# Patient Record
Sex: Female | Born: 1990 | Race: Asian | Hispanic: No | Marital: Married | Smoking: Never smoker
Health system: Southern US, Community
[De-identification: ages and names within clinical notes are randomized; demographics above are authoritative.]

## PROBLEM LIST (undated history)

## (undated) DIAGNOSIS — Z789 Other specified health status: Secondary | ICD-10-CM

## (undated) HISTORY — PX: NO PAST SURGERIES: SHX2092

---

## 2015-03-18 ENCOUNTER — Other Ambulatory Visit (HOSPITAL_COMMUNITY): Payer: Self-pay | Admitting: Specialist

## 2015-03-18 DIAGNOSIS — IMO0002 Reserved for concepts with insufficient information to code with codable children: Secondary | ICD-10-CM

## 2015-03-18 DIAGNOSIS — IMO0001 Reserved for inherently not codable concepts without codable children: Secondary | ICD-10-CM

## 2015-03-18 DIAGNOSIS — O283 Abnormal ultrasonic finding on antenatal screening of mother: Secondary | ICD-10-CM

## 2015-03-18 DIAGNOSIS — Z0489 Encounter for examination and observation for other specified reasons: Secondary | ICD-10-CM

## 2015-03-19 ENCOUNTER — Encounter (HOSPITAL_COMMUNITY): Payer: Self-pay

## 2015-03-19 ENCOUNTER — Ambulatory Visit (HOSPITAL_COMMUNITY)
Admission: RE | Admit: 2015-03-19 | Discharge: 2015-03-19 | Disposition: A | Discharge status: Home / Self Care | Payer: Medicaid Other | Source: Ambulatory Visit | Attending: Specialist | Admitting: Specialist

## 2015-03-19 ENCOUNTER — Ambulatory Visit (HOSPITAL_COMMUNITY)
Admission: RE | Admit: 2015-03-19 | Payer: Medicaid Other | Source: Ambulatory Visit | Attending: Physician Assistant | Admitting: Physician Assistant

## 2015-03-19 DIAGNOSIS — O283 Abnormal ultrasonic finding on antenatal screening of mother: Secondary | ICD-10-CM | POA: Insufficient documentation

## 2015-03-19 DIAGNOSIS — IMO0001 Reserved for inherently not codable concepts without codable children: Secondary | ICD-10-CM

## 2015-03-19 DIAGNOSIS — Z36 Encounter for antenatal screening of mother: Secondary | ICD-10-CM | POA: Diagnosis not present

## 2015-03-19 DIAGNOSIS — Z0489 Encounter for examination and observation for other specified reasons: Secondary | ICD-10-CM

## 2015-03-19 DIAGNOSIS — IMO0002 Reserved for concepts with insufficient information to code with codable children: Secondary | ICD-10-CM

## 2015-03-19 HISTORY — DX: Other specified health status: Z78.9

## 2015-03-20 ENCOUNTER — Other Ambulatory Visit (HOSPITAL_COMMUNITY): Payer: Self-pay | Admitting: Specialist

## 2015-03-21 ENCOUNTER — Other Ambulatory Visit (HOSPITAL_COMMUNITY): Payer: Self-pay | Admitting: Specialist

## 2016-01-22 ENCOUNTER — Encounter (HOSPITAL_COMMUNITY): Payer: Self-pay | Admitting: *Deleted

## 2019-10-27 ENCOUNTER — Ambulatory Visit: Payer: Self-pay | Attending: Internal Medicine

## 2019-10-27 DIAGNOSIS — Z23 Encounter for immunization: Secondary | ICD-10-CM

## 2019-10-27 NOTE — Progress Notes (Signed)
   Covid-19 Vaccination Clinic  Name:  Judith Watson    MRN: [id number] DOB: 1990-09-21  10/27/2019  [name] was observed post Covid-19 immunization for 15 minutes without incident. She was provided with Vaccine Information Sheet and instruction to access the V-Safe system.   [name] was instructed to call 911 with any severe reactions post vaccine: Marland Kitchen Difficulty breathing  . Swelling of face and throat  . A fast heartbeat  . A bad rash all over body  . Dizziness and weakness   Immunizations Administered    Name Date Dose VIS Date Route   Pfizer COVID-19 Vaccine 10/27/2019  8:19 AM 0.3 mL 07/28/2019 Intramuscular   Manufacturer: [employer], [employer]   Lot: [id number]   NDC: [address]-2

## 2019-11-20 ENCOUNTER — Ambulatory Visit: Payer: Self-pay | Attending: Internal Medicine

## 2019-11-20 DIAGNOSIS — Z23 Encounter for immunization: Secondary | ICD-10-CM

## 2019-11-20 NOTE — Progress Notes (Signed)
   Covid-19 Vaccination Clinic  Name:  Judith Watson    MRN: [id number] DOB: [birth year]  11/20/2019  [name] was observed post Covid-19 immunization for 15 minutes without incident. She was provided with Vaccine Information Sheet and instruction to access the V-Safe system.   [name] was instructed to call 911 with any severe reactions post vaccine: Marland Kitchen Difficulty breathing  . Swelling of face and throat  . A fast heartbeat  . A bad rash all over body  . Dizziness and weakness   Immunizations Administered    Name Date Dose VIS Date Route   Pfizer COVID-19 Vaccine 11/20/2019 10:01 AM 0.3 mL 07/28/2019 Intramuscular   Manufacturer: [employer], [employer]   Lot: [id number]   NDC: [address]-9

## 2024-05-22 ENCOUNTER — Ambulatory Visit (INDEPENDENT_AMBULATORY_CARE_PROVIDER_SITE_OTHER): Payer: MEDICAID

## 2024-05-22 ENCOUNTER — Other Ambulatory Visit (HOSPITAL_COMMUNITY)
Admission: RE | Admit: 2024-05-22 | Discharge: 2024-05-22 | Disposition: A | Discharge status: Home / Self Care | Payer: MEDICAID | Source: Ambulatory Visit | Attending: Obstetrics and Gynecology | Admitting: Obstetrics and Gynecology

## 2024-05-22 ENCOUNTER — Other Ambulatory Visit: Payer: Self-pay

## 2024-05-22 VITALS — BP 95/59 | HR 74 | Ht 66.0 in | Wt 140.0 lb

## 2024-05-22 DIAGNOSIS — Z3481 Encounter for supervision of other normal pregnancy, first trimester: Secondary | ICD-10-CM | POA: Insufficient documentation

## 2024-05-22 DIAGNOSIS — Z3A01 Less than 8 weeks gestation of pregnancy: Secondary | ICD-10-CM

## 2024-05-22 LAB — POCT URINE PREGNANCY: Preg Test, Ur: POSITIVE — AB

## 2024-05-22 NOTE — Progress Notes (Signed)
 New OB Intake  I explained I am completing New OB Intake today. We discussed EDD of 01/07/2025, by Last Menstrual Period. Pt is [id number]. I reviewed her allergies, medications and Medical/Surgical/OB history.    Patient Active Problem List   Diagnosis Date Noted   Supervision of normal intrauterine pregnancy in multigravida in first trimester 05/22/2024    Concerns addressed today  Patient informed that the ultrasound is considered a limited obstetric ultrasound and is not intended to be a complete ultrasound exam.  Patient also informed that the ultrasound is not being completed with the intent of assessing for fetal or placental anomalies or any pelvic abnormalities. Explained that the purpose of today's ultrasound is to assess for viability.  Patient acknowledges the purpose of the exam and the limitations of the study.     Delivery Plans Plans to deliver at [facility] Discussed the nature of our practice with multiple providers including residents and students. Due to the size of the practice, the delivering provider may not be the same as those providing prenatal care.   MyChart/Babyscripts MyChart access verified. I explained pt will have some visits in office and some virtually. Babyscripts app discussed and ordered.   Blood Pressure Cuff Blood pressure cuff discussedDiscussed to be used for virtual visits and or if needed BP checks weekly.  Anatomy US  Explained first scheduled US  will be around 19 weeks.   Last Pap No results found for: DIAGPAP  First visit review I reviewed new OB appt with patient. Explained pt will be seen by Dr. Herchel at first visit. Discussed [place] genetic screening with patient and husband. Routine prenatal labs ordered.    Erminio DELENA Rumps, [place] 05/22/2024  2:29 PM

## 2024-05-23 ENCOUNTER — Ambulatory Visit: Payer: Self-pay | Admitting: Obstetrics and Gynecology

## 2024-05-23 DIAGNOSIS — D696 Thrombocytopenia, unspecified: Secondary | ICD-10-CM | POA: Insufficient documentation

## 2024-05-23 LAB — CBC/D/PLT+RPR+RH+ABO+RUBIGG...
Antibody Screen: NEGATIVE
Basophils Absolute: 0 x10E3/uL (ref 0.0–0.2)
Basos: 1 %
EOS (ABSOLUTE): 0.1 x10E3/uL (ref 0.0–0.4)
Eos: 2 %
HCV Ab: NONREACTIVE
HIV Screen 4th Generation wRfx: NONREACTIVE
Hematocrit: 38.6 % (ref 34.0–46.6)
Hemoglobin: 12.9 g/dL (ref 11.1–15.9)
Hepatitis B Surface Ag: NEGATIVE
Immature Grans (Abs): 0 x10E3/uL (ref 0.0–0.1)
Immature Granulocytes: 0 %
Lymphocytes Absolute: 1.5 x10E3/uL (ref 0.7–3.1)
Lymphs: 20 %
MCH: 31.9 pg (ref 26.6–33.0)
MCHC: 33.4 g/dL (ref 31.5–35.7)
MCV: 95 fL (ref 79–97)
Monocytes Absolute: 0.6 x10E3/uL (ref 0.1–0.9)
Monocytes: 7 %
Neutrophils Absolute: 5.2 x10E3/uL (ref 1.4–7.0)
Neutrophils: 70 %
Platelets: 144 x10E3/uL — ABNORMAL LOW (ref 150–450)
RBC: 4.05 x10E6/uL (ref 3.77–5.28)
RDW: 11.9 % (ref 11.7–15.4)
RPR Ser Ql: NONREACTIVE
Rh Factor: POSITIVE
Rubella Antibodies, IGG: 20.4 {index} (ref >0.99)
WBC: 7.5 x10E3/uL (ref 3.4–10.8)

## 2024-05-23 LAB — HCV INTERPRETATION

## 2024-05-24 LAB — CERVICOVAGINAL ANCILLARY ONLY
Chlamydia: NEGATIVE
Comment: NEGATIVE
Comment: NORMAL
Neisseria Gonorrhea: NEGATIVE

## 2024-05-24 LAB — URINE CULTURE, OB REFLEX

## 2024-05-24 LAB — CULTURE, OB URINE

## 2024-05-29 ENCOUNTER — Other Ambulatory Visit: Payer: Self-pay | Admitting: Obstetrics and Gynecology

## 2024-05-29 ENCOUNTER — Other Ambulatory Visit (INDEPENDENT_AMBULATORY_CARE_PROVIDER_SITE_OTHER): Payer: MEDICAID

## 2024-05-29 ENCOUNTER — Other Ambulatory Visit: Payer: Self-pay

## 2024-05-29 DIAGNOSIS — Z3A08 8 weeks gestation of pregnancy: Secondary | ICD-10-CM

## 2024-05-29 DIAGNOSIS — Z3481 Encounter for supervision of other normal pregnancy, first trimester: Secondary | ICD-10-CM

## 2024-05-29 NOTE — Telephone Encounter (Signed)
-----   Message from Rollo ONEIDA Bring sent at 05/24/2024  2:43 PM EDT ----- Normal swab & urine ----- Message ----- From: Venus Erminio LABOR, RN Sent: 05/22/2024   2:29 PM EDT To: Rollo ONEIDA Bring, MD

## 2024-05-29 NOTE — Telephone Encounter (Signed)
 Patient made aware. Danna Hefty, RN

## 2024-06-19 ENCOUNTER — Encounter: Payer: MEDICAID | Admitting: Obstetrics & Gynecology

## 2024-07-03 ENCOUNTER — Encounter: Payer: MEDICAID | Admitting: Obstetrics & Gynecology

## 2024-07-05 ENCOUNTER — Other Ambulatory Visit (HOSPITAL_COMMUNITY)
Admission: RE | Admit: 2024-07-05 | Discharge: 2024-07-05 | Disposition: A | Discharge status: Home / Self Care | Payer: MEDICAID | Source: Ambulatory Visit | Attending: Obstetrics & Gynecology | Admitting: Obstetrics & Gynecology

## 2024-07-05 ENCOUNTER — Encounter: Payer: Self-pay | Admitting: Obstetrics & Gynecology

## 2024-07-05 ENCOUNTER — Ambulatory Visit: Payer: MEDICAID | Admitting: Obstetrics & Gynecology

## 2024-07-05 VITALS — BP 99/64 | HR 80 | Wt 146.0 lb

## 2024-07-05 DIAGNOSIS — Z01419 Encounter for gynecological examination (general) (routine) without abnormal findings: Secondary | ICD-10-CM | POA: Diagnosis present

## 2024-07-05 DIAGNOSIS — Z3480 Encounter for supervision of other normal pregnancy, unspecified trimester: Secondary | ICD-10-CM | POA: Diagnosis not present

## 2024-07-05 DIAGNOSIS — Z113 Encounter for screening for infections with a predominantly sexual mode of transmission: Secondary | ICD-10-CM | POA: Diagnosis not present

## 2024-07-05 DIAGNOSIS — N898 Other specified noninflammatory disorders of vagina: Secondary | ICD-10-CM | POA: Diagnosis present

## 2024-07-05 DIAGNOSIS — Z124 Encounter for screening for malignant neoplasm of cervix: Secondary | ICD-10-CM | POA: Insufficient documentation

## 2024-07-05 DIAGNOSIS — Z1151 Encounter for screening for human papillomavirus (HPV): Secondary | ICD-10-CM | POA: Insufficient documentation

## 2024-07-05 DIAGNOSIS — Z3A13 13 weeks gestation of pregnancy: Secondary | ICD-10-CM

## 2024-07-05 NOTE — Progress Notes (Signed)
 INITIAL PRENATAL VISIT  History:  Judith Watson is a 33 y.o. [phone] at [redacted]w[redacted]d by LMP, early ultrasound being seen today for her first obstetrical visit.  Her obstetrical history is significant for two term pregnancies, had twins last pregnancy.  All vaginal deliveries. Patient does intend to breast feed. Pregnancy history fully reviewed.  Patient reports vaginal odor and discharge for a few days, wants evaluation.  HISTORY: OB History  Gravida Para Term Preterm AB Living  6 3 3  0 1 3  SAB IAB Ectopic Multiple Live Births  1 0 0 1 3    # Outcome Date GA Lbr Len/2nd Weight Sex Type Anes PTL Lv  6 Current           5A Term 04/16/15 [redacted]w[redacted]d  6 lb 3 oz (2.807 kg) M Vag-Spont None Y LIV  5B Term 04/16/15 [redacted]w[redacted]d  5 lb 9 oz (2.523 kg) M Vag-Spont None N LIV  4 Term 04/15/14 [redacted]w[redacted]d  7 lb 6 oz (3.345 kg) F Vag-Spont None Y LIV  3 Term           2 SAB           1 Gravida             Last pap smear was done many years ago and was normal  Past Medical History:  Diagnosis Date   Medical history non-contributory    Past Surgical History:  Procedure Laterality Date   NO PAST SURGERIES     Family History  Problem Relation Age of Onset   Asthma Mother    Asthma Daughter    Asthma Maternal Grandmother    Social History   Tobacco Use   Smoking status: Never   Smokeless tobacco: Never  Vaping Use   Vaping status: Never Used  Substance Use Topics   Alcohol use: Not Currently   Drug use: No   Allergies  Allergen Reactions   Latex    Current Outpatient Medications on File Prior to Visit  Medication Sig Dispense Refill   Prenatal Vit-Fe Fumarate-FA (PRENATAL VITAMIN PO) Take by mouth.     No current facility-administered medications on file prior to visit.    Review of Systems Pertinent items noted in HPI and remainder of comprehensive ROS otherwise negative.  Physical Exam:  Chaperone Shawnee Fleet, CMA   Vitals:   07/05/24 1352  BP: 99/64  Pulse: 80  Weight: 146 lb 0.6 oz  (66.2 kg)   Fetal Heart Rate (bpm): 159   General: well-developed, well-nourished female in no acute distress  Breasts:  normal appearance, no masses or tenderness bilaterally, exam done in the presence of a chaperone.   Skin: normal coloration and turgor, no rashes  Neurologic: oriented, normal, negative, normal mood  Extremities: normal strength, tone, and muscle mass, ROM of all joints is normal  HEENT PERRLA, extraocular movement intact and sclera clear, anicteric  Neck supple and no masses  Cardiovascular: regular rate and rhythm  Respiratory:  no respiratory distress, normal breath sounds  Abdomen: soft, non-tender; bowel sounds normal; no masses,  no organomegaly  Pelvic: normal external genitalia, no lesions, normal vaginal mucosa, yellow vaginal discharge seen and testing sample obtained, normal cervix, pap smear done. Exam done in the presence of a chaperone.    Assessment:  Pregnancy: [id number] Patient Active Problem List   Diagnosis Date Noted   Thrombocytopenia 05/23/2024   Supervision of normal intrauterine pregnancy in multigravida in first trimester 05/22/2024    Plan:  1.  Vaginal odor - Cervicovaginal ancillary only( [place]) done, will follow up results and manage accordingly.   2. [redacted] weeks gestation of pregnancy 3. Supervision of other normal pregnancy, antepartum (Primary) - Hemoglobin A1c - CBC - Cervicovaginal ancillary only( [place]) - Cytology - PAP - Comp Met (CMET) - TSH Rfx on Abnormal to Free T4 - US  MFM OB COMP + 14 WK; Future - HORIZON CUSTOM - PANORAMA PRENATAL TEST  Initial labs drawn at intake visit reviewed, no concerns. Remaining labs ordered. Continue prenatal vitamins. Problem list reviewed and updated. Genetic Screening discussed, Panorama and Horizon: ordered. Ultrasound discussed; fetal anatomic survey: planned. Anticipatory guidance about prenatal visits given including labs, ultrasounds, and testing. Weight gain  recommendations per IOM guidelines reviewed: underweight/BMI 18.5 or less > 28 - 40 lbs; normal weight/BMI 18.5 - 24.9 > 25 - 35 lbs; overweight/BMI 25 - 29.9 > 15 - 25 lbs; obese/BMI 30 or more > 11 - 20 lbs. Discussed usage of the Babyscripts app for more information about pregnancy, and to track blood pressures. Also discussed usage of virtual visits as additional source of managing and completing prenatal visits.  Patient was encouraged to use MyChart to review results, send requests, and have questions addressed.   The nature of Center for [facility]Faculty Practice with multiple MDs and Advanced Practice Providers was explained to patient; also emphasized that residents, students are part of our team. Routine obstetric precautions reviewed. Encouraged to seek out care at our office or emergency room [facility] preferred) for urgent and/or emergent concerns. Return in about 4 weeks (around 08/02/2024) for OFFICE OB VISIT (MD or APP).     GLORIS HUGGER, MD, FACOG Obstetrician & Gynecologist, [facility] for [employer], [facility]

## 2024-07-06 ENCOUNTER — Ambulatory Visit: Payer: Self-pay | Admitting: Obstetrics & Gynecology

## 2024-07-06 DIAGNOSIS — Z3481 Encounter for supervision of other normal pregnancy, first trimester: Secondary | ICD-10-CM

## 2024-07-06 LAB — COMPREHENSIVE METABOLIC PANEL WITH GFR
ALT: 16 IU/L (ref 0–32)
AST: 22 IU/L (ref 0–40)
Albumin: 4.3 g/dL (ref 4.0–5.0)
Alkaline Phosphatase: 51 IU/L (ref 41–116)
BUN/Creatinine Ratio: 20 (ref 9–23)
BUN: 9 mg/dL (ref 6–20)
Bilirubin Total: <0.2 mg/dL (ref 0.0–1.2)
CO2: 17 mmol/L — ABNORMAL LOW (ref 20–29)
Calcium: 9 mg/dL (ref 8.7–10.2)
Chloride: 101 mmol/L (ref 96–106)
Creatinine, Ser: 0.45 mg/dL — ABNORMAL LOW (ref 0.57–1.00)
Globulin, Total: 2.7 g/dL (ref 1.5–4.5)
Glucose: 77 mg/dL (ref 70–99)
Potassium: 3.9 mmol/L (ref 3.5–5.2)
Sodium: 135 mmol/L (ref 134–144)
Total Protein: 7 g/dL (ref 6.0–8.5)
eGFR: 133 mL/min/1.73 (ref >59)

## 2024-07-06 LAB — CBC
Hematocrit: 37.7 % (ref 34.0–46.6)
Hemoglobin: 12.4 g/dL (ref 11.1–15.9)
MCH: 31.2 pg (ref 26.6–33.0)
MCHC: 32.9 g/dL (ref 31.5–35.7)
MCV: 95 fL (ref 79–97)
Platelets: 150 x10E3/uL (ref 150–450)
RBC: 3.98 x10E6/uL (ref 3.77–5.28)
RDW: 12.1 % (ref 11.7–15.4)
WBC: 9.1 x10E3/uL (ref 3.4–10.8)

## 2024-07-06 LAB — HEMOGLOBIN A1C
Est. average glucose Bld gHb Est-mCnc: 94 mg/dL
Hgb A1c MFr Bld: 4.9 % (ref 4.8–5.6)

## 2024-07-06 LAB — TSH RFX ON ABNORMAL TO FREE T4: TSH: 0.733 u[IU]/mL (ref 0.450–4.500)

## 2024-07-07 LAB — CERVICOVAGINAL ANCILLARY ONLY
Bacterial Vaginitis (gardnerella): NEGATIVE
Candida Glabrata: NEGATIVE
Candida Vaginitis: NEGATIVE
Comment: NEGATIVE
Comment: NEGATIVE
Comment: NEGATIVE

## 2024-07-11 LAB — CYTOLOGY - PAP
Comment: NEGATIVE
Diagnosis: NEGATIVE
Diagnosis: REACTIVE
High risk HPV: NEGATIVE

## 2024-07-12 ENCOUNTER — Encounter: Payer: Self-pay | Admitting: Obstetrics & Gynecology

## 2024-07-13 LAB — PANORAMA PRENATAL TEST FULL PANEL:PANORAMA TEST PLUS 5 ADDITIONAL MICRODELETIONS: FETAL FRACTION: 8.6

## 2024-07-13 LAB — HORIZON CUSTOM: REPORT SUMMARY: NEGATIVE

## 2024-07-31 ENCOUNTER — Encounter: Payer: Self-pay | Admitting: Obstetrics & Gynecology

## 2024-07-31 ENCOUNTER — Ambulatory Visit: Admitting: Obstetrics & Gynecology

## 2024-07-31 VITALS — BP 96/61 | HR 80 | Wt 150.1 lb

## 2024-07-31 DIAGNOSIS — Z3481 Encounter for supervision of other normal pregnancy, first trimester: Secondary | ICD-10-CM

## 2024-07-31 DIAGNOSIS — D693 Immune thrombocytopenic purpura: Secondary | ICD-10-CM

## 2024-07-31 DIAGNOSIS — Z3A17 17 weeks gestation of pregnancy: Secondary | ICD-10-CM

## 2024-07-31 NOTE — Progress Notes (Signed)
° °  PRENATAL VISIT NOTE  Subjective:  Judith Watson is a 33 y.o. [phone] at [redacted]w[redacted]d being seen today for ongoing prenatal care.  She is currently monitored for the following issues for this low-risk pregnancy and has Supervision of normal intrauterine pregnancy in multigravida in first trimester and Idiopathic thrombocytopenia (HCC) on their problem list.  Patient reports no complaints.  Contractions: Not present. Vag. Bleeding: None.   . Denies leaking of fluid. Here with FOB.  The following portions of the patient's history were reviewed and updated as appropriate: allergies, current medications, past family history, past medical history, past social history, past surgical history and problem list.   Objective:   Vitals:   07/31/24 1329  BP: 96/61  Pulse: 80  Weight: 150 lb 1.3 oz (68.1 kg)    Fetal Status:  Fetal Heart Rate (bpm): 146        General: Alert, oriented and cooperative. Patient is in no acute distress.  Skin: Skin is warm and dry. No rash noted.   Cardiovascular: Normal heart rate noted  Respiratory: Normal respiratory effort, no problems with respiration noted  Abdomen: Soft, gravid, appropriate for gestational age.  Pain/Pressure: Present     Pelvic: Cervical exam deferred        Extremities: Normal range of motion.  Edema: None  Mental Status: Normal mood and affect. Normal behavior. Normal judgment and thought content.      07/05/2024    2:06 PM 03/19/2015    7:37 AM  Depression screen PHQ 2/9  Decreased Interest 1 0  Down, Depressed, Hopeless 0 0  PHQ - 2 Score 1 0  Altered sleeping 1   Tired, decreased energy 1   Change in appetite 1   Feeling bad or failure about yourself  1   Trouble concentrating 0   Moving slowly or fidgety/restless 0   Suicidal thoughts 0   PHQ-9 Score 5         07/05/2024    2:06 PM  GAD 7 : Generalized Anxiety Score  Nervous, Anxious, on Edge 0  Control/stop worrying 0  Worry too much - different things 0  Trouble relaxing 0   Restless 1  Easily annoyed or irritable 0  Afraid - awful might happen 0  Total GAD 7 Score 1    Assessment and Plan:  Pregnancy: [id number] at [redacted]w[redacted]d 1. Idiopathic thrombocytopenia (HCC)    Latest Ref Rng & Units 07/05/2024    4:25 PM 05/22/2024    2:34 PM  CBC  WBC 3.4 - 10.8 x10E3/uL 9.1  7.5   Hemoglobin 11.1 - 15.9 g/dL 87.5  87.0   Hematocrit 34.0 - 46.6 % 37.7  38.6   Platelets 150 - 450 x10E3/uL 150  144   Will repeat check in third trimester.  2. [redacted] weeks gestation of pregnancy 3. Supervision of normal intrauterine pregnancy in multigravida in first trimester (Primary) LR NIPS.  Declined AFP. Scheduled for anatomy scan. No other complaints or concerns.  Routine obstetric precautions reviewed.  Please refer to After Visit Summary for other counseling recommendations.   Return in about 4 weeks (around 08/28/2024) for OFFICE OB VISIT (MD or APP).  Future Appointments  Date Time Provider Department Center  08/28/2024  9:35 AM Abigail, Rollo DASEN, MD [facility] None  09/11/2024  8:00 AM [facility] PROVIDER 1 [facility]  09/11/2024  8:30 AM [facility] US5 [facility]  09/25/2024  9:15 AM Nansi Birmingham, Gloris LABOR, MD [facility] None    Gloris Hugger, MD

## 2024-08-28 ENCOUNTER — Ambulatory Visit (INDEPENDENT_AMBULATORY_CARE_PROVIDER_SITE_OTHER): Payer: MEDICAID | Admitting: Obstetrics and Gynecology

## 2024-08-28 VITALS — BP 95/57 | HR 96 | Wt 151.1 lb

## 2024-08-28 DIAGNOSIS — Z3481 Encounter for supervision of other normal pregnancy, first trimester: Secondary | ICD-10-CM | POA: Diagnosis not present

## 2024-08-28 DIAGNOSIS — D693 Immune thrombocytopenic purpura: Secondary | ICD-10-CM

## 2024-08-28 DIAGNOSIS — Z302 Encounter for sterilization: Secondary | ICD-10-CM | POA: Insufficient documentation

## 2024-08-28 DIAGNOSIS — Z3A21 21 weeks gestation of pregnancy: Secondary | ICD-10-CM | POA: Diagnosis not present

## 2024-08-28 NOTE — Progress Notes (Signed)
" ° °  PRENATAL VISIT NOTE  Subjective:  Judith Watson is a 34 y.o. [phone] at [redacted]w[redacted]d being seen today for ongoing prenatal care.  She is currently monitored for the following issues for this low-risk pregnancy and has Supervision of normal intrauterine pregnancy in multigravida in first trimester; Idiopathic thrombocytopenia (HCC); and Request for sterilization on their problem list.  Patient reports no complaints.  Contractions: Not present. Vag. Bleeding: None.  Movement: Present. Denies leaking of fluid.   The following portions of the patient's history were reviewed and updated as appropriate: allergies, current medications, past family history, past medical history, past social history, past surgical history and problem list.   Objective:   Vitals:   08/28/24 0940  BP: (!) 95/57  Pulse: 96  Weight: 151 lb 1.9 oz (68.5 kg)    Fetal Status:  Fetal Heart Rate (bpm): 152 Fundal Height: 22 cm Movement: Present    General: Alert, oriented and cooperative. Patient is in no acute distress.  Skin: Skin is warm and dry. No rash noted.   Cardiovascular: Normal heart rate noted  Respiratory: Normal respiratory effort, no problems with respiration noted  Abdomen: Soft, gravid, appropriate for gestational age.  Pain/Pressure: Absent     Pelvic: Cervical exam deferred        Extremities: Normal range of motion.  Edema: None  Mental Status: Normal mood and affect. Normal behavior. Normal judgment and thought content.    Assessment and Plan:  Pregnancy: [id number] at [redacted]w[redacted]d 1. Supervision of normal intrauterine pregnancy in multigravida in first trimester (Primary) Anticipatory guidance Anatomy scheduled Declines AFP Pt reports she received her flu shot. After completion of visit- no flu shot documented in immunizations. Will clarify where she received it at future visits  2. Idiopathic thrombocytopenia (HCC) CBC with third trimester labs  3. Request for sterilization Patient wants tubal. Will  sign papers next visit  4. [redacted] weeks gestation of pregnancy    Preterm labor symptoms and general obstetric precautions including but not limited to vaginal bleeding, contractions, leaking of fluid and fetal movement were reviewed in detail with the patient. Please refer to After Visit Summary for other counseling recommendations.   Return in about 4 weeks (around 09/25/2024).  Future Appointments  Date Time Provider Department Center  09/11/2024  8:00 AM [facility] PROVIDER 1 [facility]  09/11/2024  8:30 AM [facility] US5 [facility]  09/25/2024  9:15 AM Anyanwu, Gloris LABOR, MD [facility] None  10/23/2024  8:15 AM [facility] MIDWIFE [facility] None  11/06/2024  9:35 AM [facility] MIDWIFE [facility] None  11/20/2024  9:35 AM [facility] MIDWIFE [facility] None  12/04/2024  9:35 AM [facility] MIDWIFE [facility] None  12/11/2024  9:35 AM [facility] MIDWIFE [facility] None  12/18/2024  9:35 AM [facility] MIDWIFE [facility] None  12/25/2024  9:35 AM [facility] MIDWIFE [facility] None  01/01/2025  9:35 AM [facility] MIDWIFE [facility] None  01/08/2025  9:35 AM [facility] MIDWIFE [facility] None    Rollo ONEIDA Bring, MD "

## 2024-09-11 ENCOUNTER — Other Ambulatory Visit

## 2024-09-11 ENCOUNTER — Ambulatory Visit

## 2024-09-25 ENCOUNTER — Encounter: Admitting: Obstetrics & Gynecology

## 2024-10-13 ENCOUNTER — Other Ambulatory Visit

## 2024-10-13 ENCOUNTER — Ambulatory Visit

## 2024-11-06 ENCOUNTER — Encounter

## 2024-11-20 ENCOUNTER — Encounter

## 2024-12-04 ENCOUNTER — Encounter

## 2024-12-11 ENCOUNTER — Encounter

## 2024-12-18 ENCOUNTER — Encounter

## 2024-12-25 ENCOUNTER — Encounter

## 2025-01-01 ENCOUNTER — Encounter

## 2025-01-08 ENCOUNTER — Encounter
